# Patient Record
Sex: Male | Born: 1965 | Race: White | Hispanic: No | Marital: Married | Smoking: Never smoker
Health system: Southern US, Community
[De-identification: ages and names within clinical notes are randomized; demographics above are authoritative.]

## PROBLEM LIST (undated history)

## (undated) DIAGNOSIS — F32A Depression, unspecified: Secondary | ICD-10-CM

## (undated) DIAGNOSIS — F329 Major depressive disorder, single episode, unspecified: Secondary | ICD-10-CM

---

## 2007-02-25 ENCOUNTER — Emergency Department (HOSPITAL_COMMUNITY): Admission: EM | Admit: 2007-02-25 | Discharge: 2007-02-25 | Payer: Self-pay | Admitting: Emergency Medicine

## 2008-05-10 IMAGING — CR DG CHEST 2V
2 series · 2 of 2 positions shown · non-contrast
Comparison: none

CLINICAL DATA: 40-year-old male, chest pain.  
 CHEST - 2 VIEW:

[w chest pa]
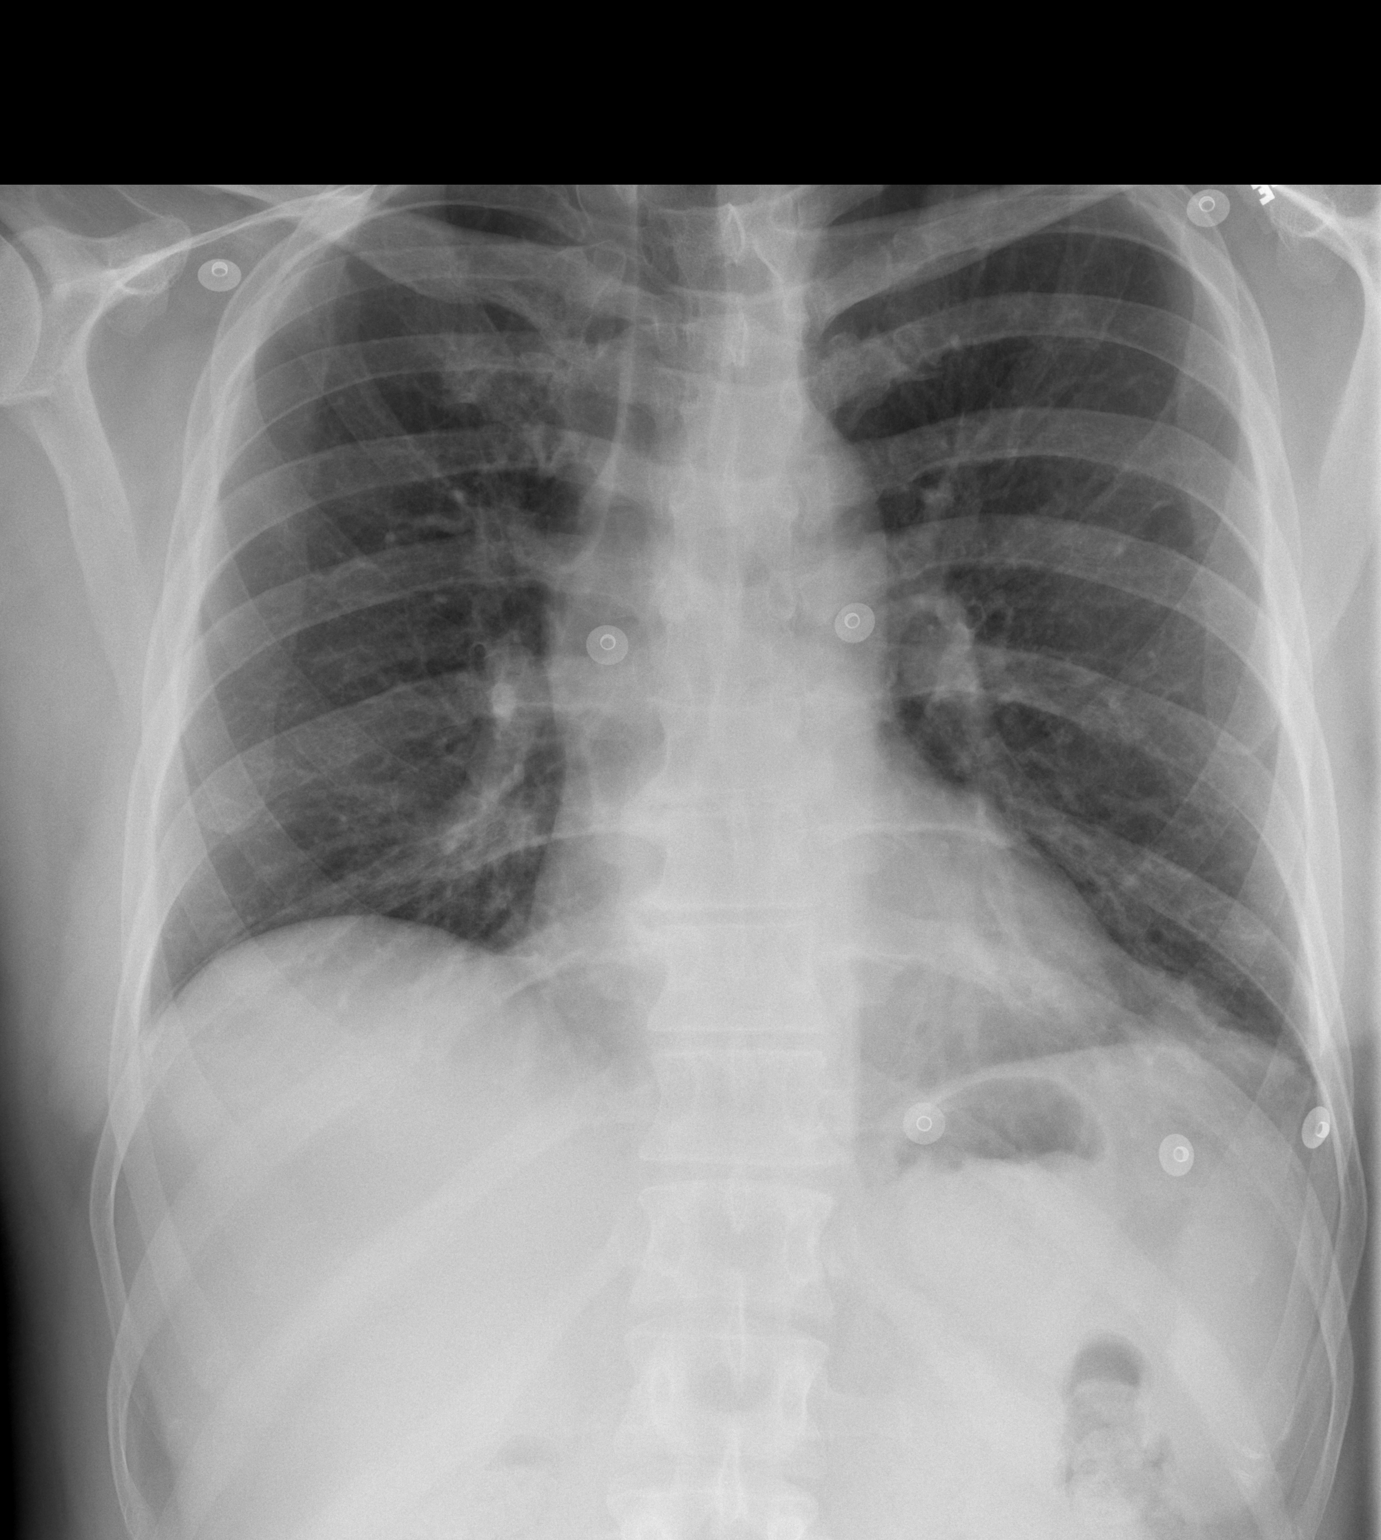

[w chest lat]
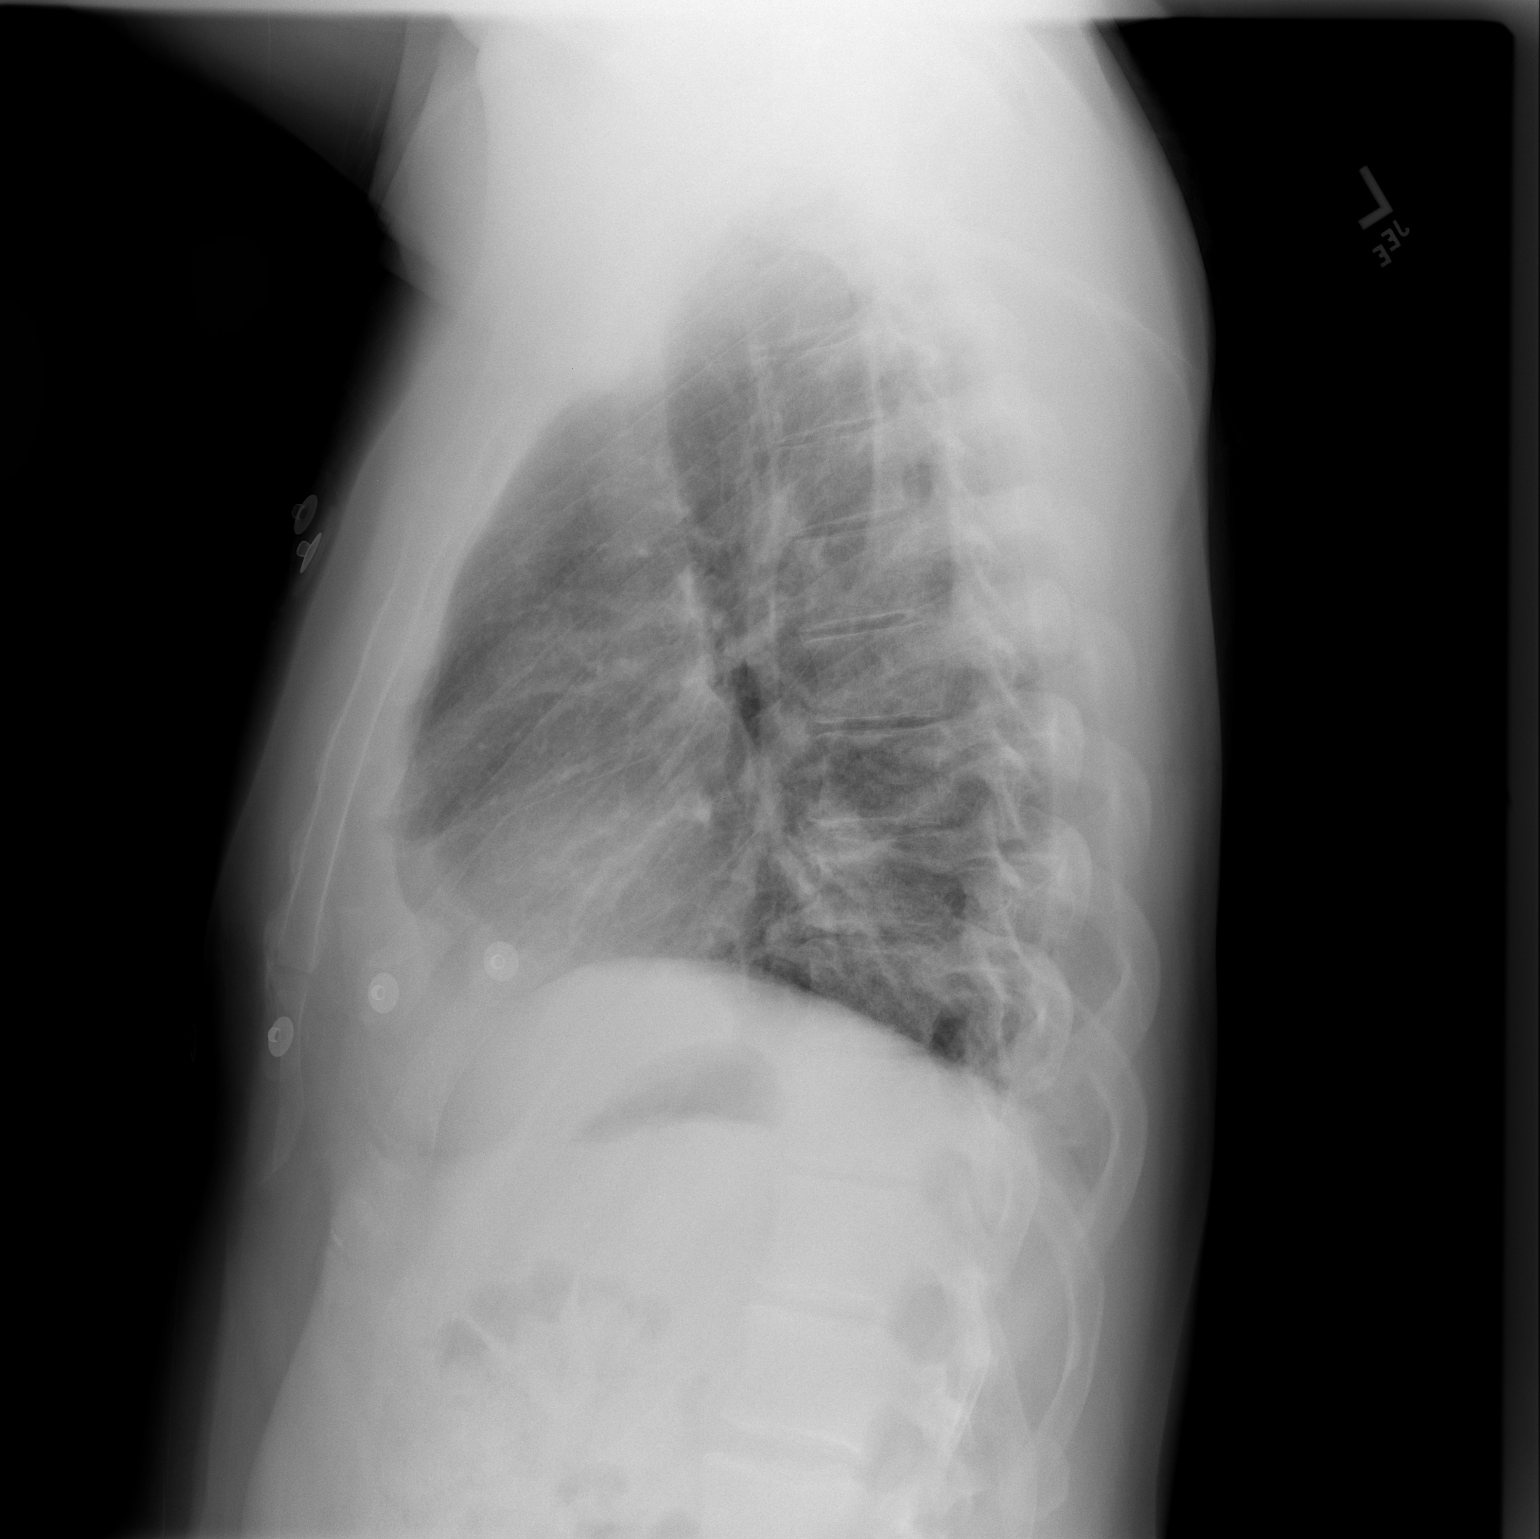

[2 of 2 positions shown; findings below may reference images not displayed]

FINDINGS: Cardiopericardial silhouette is within normal limits for size.  There is mild focal air space disease at the left base.  Some bronchitic changes are noted throughout.  Degenerative changes are seen in the thoracic spine.
IMPRESSION: Left lower lobe air space disease.  While this may represent atelectasis, early infection is not excluded.

## 2011-07-02 LAB — DIFFERENTIAL
Basophils Relative: 0
Lymphs Abs: 2
Monocytes Relative: 10
Neutro Abs: 5
Neutrophils Relative %: 64

## 2011-07-02 LAB — CBC
MCHC: 34.8
Platelets: 181
RBC: 5.07
WBC: 7.9

## 2011-07-02 LAB — I-STAT 8, (EC8 V) (CONVERTED LAB)
BUN: 18
Glucose, Bld: 109 — ABNORMAL HIGH
Hemoglobin: 15
Potassium: 3.6
TCO2: 31
pH, Ven: 7.39 — ABNORMAL HIGH

## 2011-07-02 LAB — CULTURE, BLOOD (ROUTINE X 2): Culture: NO GROWTH

## 2011-07-02 LAB — POCT I-STAT CREATININE: Operator id: 161631

## 2011-07-02 LAB — D-DIMER, QUANTITATIVE: D-Dimer, Quant: <0.22

## 2011-07-02 LAB — POCT CARDIAC MARKERS
Myoglobin, poc: 52.4
Operator id: 161631

## 2015-03-08 ENCOUNTER — Other Ambulatory Visit: Payer: Self-pay | Admitting: Physician Assistant

## 2015-03-08 ENCOUNTER — Ambulatory Visit
Admission: RE | Admit: 2015-03-08 | Discharge: 2015-03-08 | Disposition: A | Discharge status: Home / Self Care | Payer: BLUE CROSS/BLUE SHIELD | Source: Ambulatory Visit | Attending: Physician Assistant | Admitting: Physician Assistant

## 2015-03-08 DIAGNOSIS — R52 Pain, unspecified: Secondary | ICD-10-CM

## 2016-01-14 ENCOUNTER — Ambulatory Visit
Admission: EM | Admit: 2016-01-14 | Discharge: 2016-01-14 | Disposition: A | Discharge status: Home / Self Care | Payer: BLUE CROSS/BLUE SHIELD | Attending: Family Medicine | Admitting: Family Medicine

## 2016-01-14 ENCOUNTER — Encounter: Payer: Self-pay | Admitting: Emergency Medicine

## 2016-01-14 DIAGNOSIS — J302 Other seasonal allergic rhinitis: Secondary | ICD-10-CM | POA: Diagnosis not present

## 2016-01-14 MED ORDER — PREDNISONE 20 MG PO TABS: ORAL_TABLET | ORAL | Status: AC

## 2016-01-14 NOTE — ED Notes (Signed)
Patient c/o redness and drainage from both eyes for the past couple of days.  Patient denies fevers.

## 2016-01-14 NOTE — ED Provider Notes (Signed)
CSN: [id number]     Arrival date & time 01/14/16  [id number] History   First MD Initiated Contact with Patient 01/14/16 [phone]     Chief Complaint  Patient presents with  . Eye Drainage   (Consider location/radiation/quality/duration/timing/severity/associated sxs/prior Treatment) HPI Comments: 50 yo male with a 2-3 days h/o bilateral eye redness, itching and watery drainage which started after mowing lawn. Denies any purulent drainage, fevers, chills, eye injuries, fevers, chills.   The history is provided by the patient.    History reviewed. No pertinent past medical history. History reviewed. No pertinent past surgical history. History reviewed. No pertinent family history. Social History  Substance Use Topics  . Smoking status: Never Smoker   . Smokeless tobacco: None  . Alcohol Use: Yes    Review of Systems  Allergies  Review of patient's allergies indicates no known allergies.  Home Medications   Prior to Admission medications   Medication Sig Start Date End Date Taking? Authorizing Provider  predniSONE (DELTASONE) 20 MG tablet 3 tabs po qd for 2 days, then 2 tabs po qd for 3 days, then 1 tab po qd for 3 days, then half a tab po qd for 2 days 01/14/16   Payton Mccallumrlando Mical Brun, MD   Meds Ordered and Administered this Visit  Medications - No data to display  BP 142/85 mmHg  Pulse 66  Temp(Src) 97 F (36.1 C) (Tympanic)  Resp 16  Ht 6\' 2"  (1.88 m)  Wt 260 lb (117.935 kg)  BMI 33.37 kg/m2  SpO2 98% No data found.   Physical Exam  Constitutional: He appears well-developed and well-nourished. No distress.  HENT:  Head: Normocephalic and atraumatic.  Right Ear: External ear normal.  Left Ear: External ear normal.  Nose: Nose normal.  Mouth/Throat: Oropharynx is clear and moist. No oropharyngeal exudate.  Eyes: EOM are normal. Pupils are equal, round, and reactive to light. Right eye exhibits no discharge and no exudate. Left eye exhibits no discharge and no exudate. Right  conjunctiva is injected. Left conjunctiva is injected. No scleral icterus.  Skin: He is not diaphoretic.  Nursing note and vitals reviewed.   ED Course  Procedures (including critical care time)  Labs Review Labs Reviewed - No data to display  Imaging Review No results found.   Visual Acuity Review  Right Eye Distance: 20/25 uncorrected Left Eye Distance: 20/25 uncorrected Bilateral Distance:    Right Eye Near:   Left Eye Near:    Bilateral Near:         MDM   1. Seasonal allergies    Discharge Medication List as of 01/14/2016  9:29 AM    START taking these medications   Details  predniSONE (DELTASONE) 20 MG tablet 3 tabs po qd for 2 days, then 2 tabs po qd for 3 days, then 1 tab po qd for 3 days, then half a tab po qd for 2 days, Normal       1. diagnosis reviewed with patient 2. rx as per orders above; reviewed possible side effects, interactions, risks and benefits  3. Recommend supportive treatment with otc antihistamines prn  4. Follow-up prn if symptoms worsen or don't improve    Payton Mccallumrlando Canaan Prue, MD 01/20/16 [phone]

## 2016-01-29 DIAGNOSIS — R05 Cough: Secondary | ICD-10-CM | POA: Diagnosis not present

## 2016-01-29 DIAGNOSIS — Z6835 Body mass index (BMI) 35.0-35.9, adult: Secondary | ICD-10-CM | POA: Diagnosis not present

## 2016-01-29 DIAGNOSIS — J309 Allergic rhinitis, unspecified: Secondary | ICD-10-CM | POA: Diagnosis not present

## 2016-05-21 IMAGING — CR DG FOOT COMPLETE 3+V*R*
3 series · 3 of 3 positions shown · non-contrast
Comparison: August 18, 2010

CLINICAL DATA: Chronic pain lateral aspect of foot

EXAM:
RIGHT FOOT COMPLETE - 3+ VIEW

[t foot ap right]
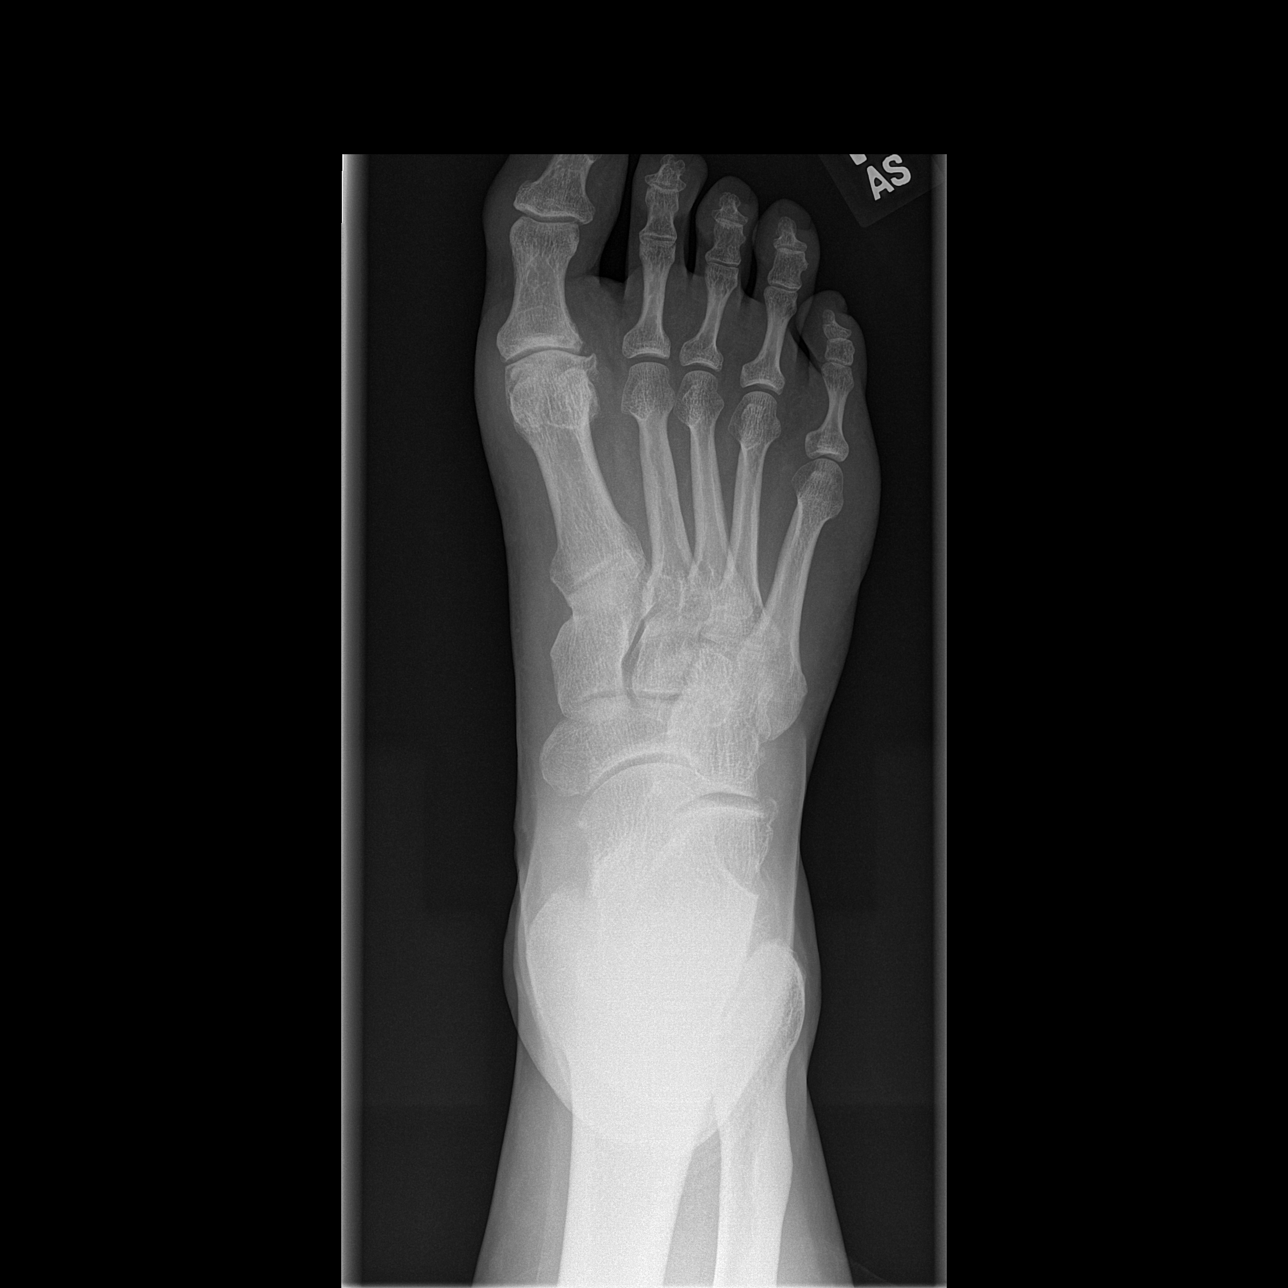

[t foot oblique right]
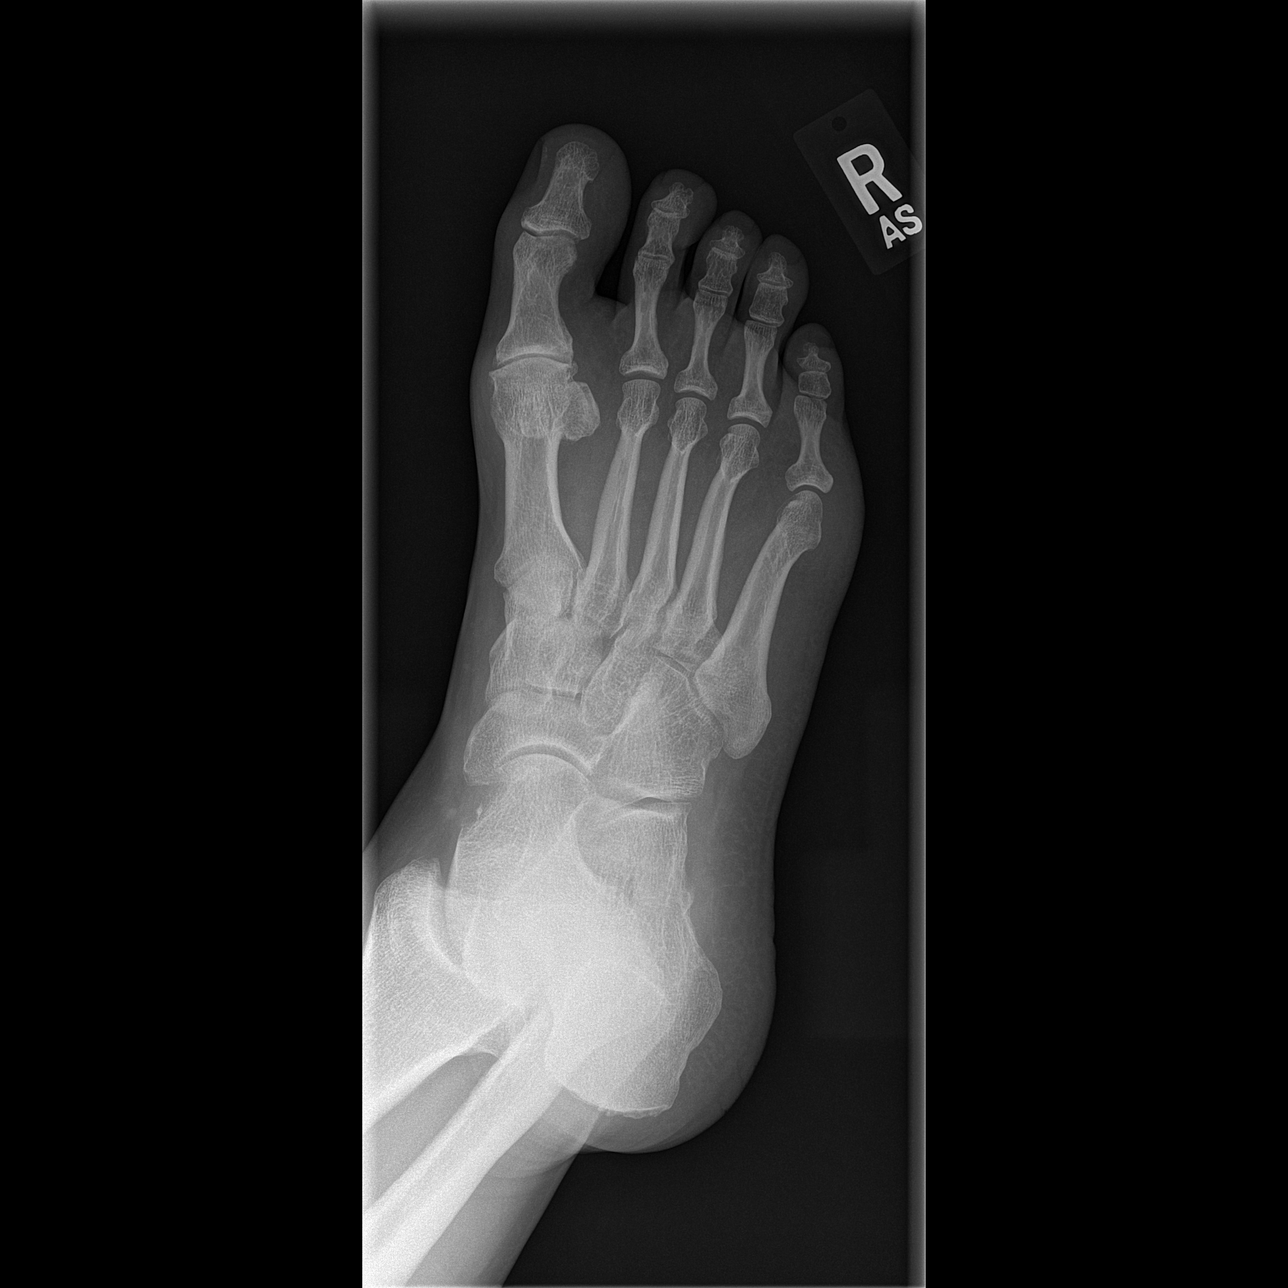

[t foot lat right]
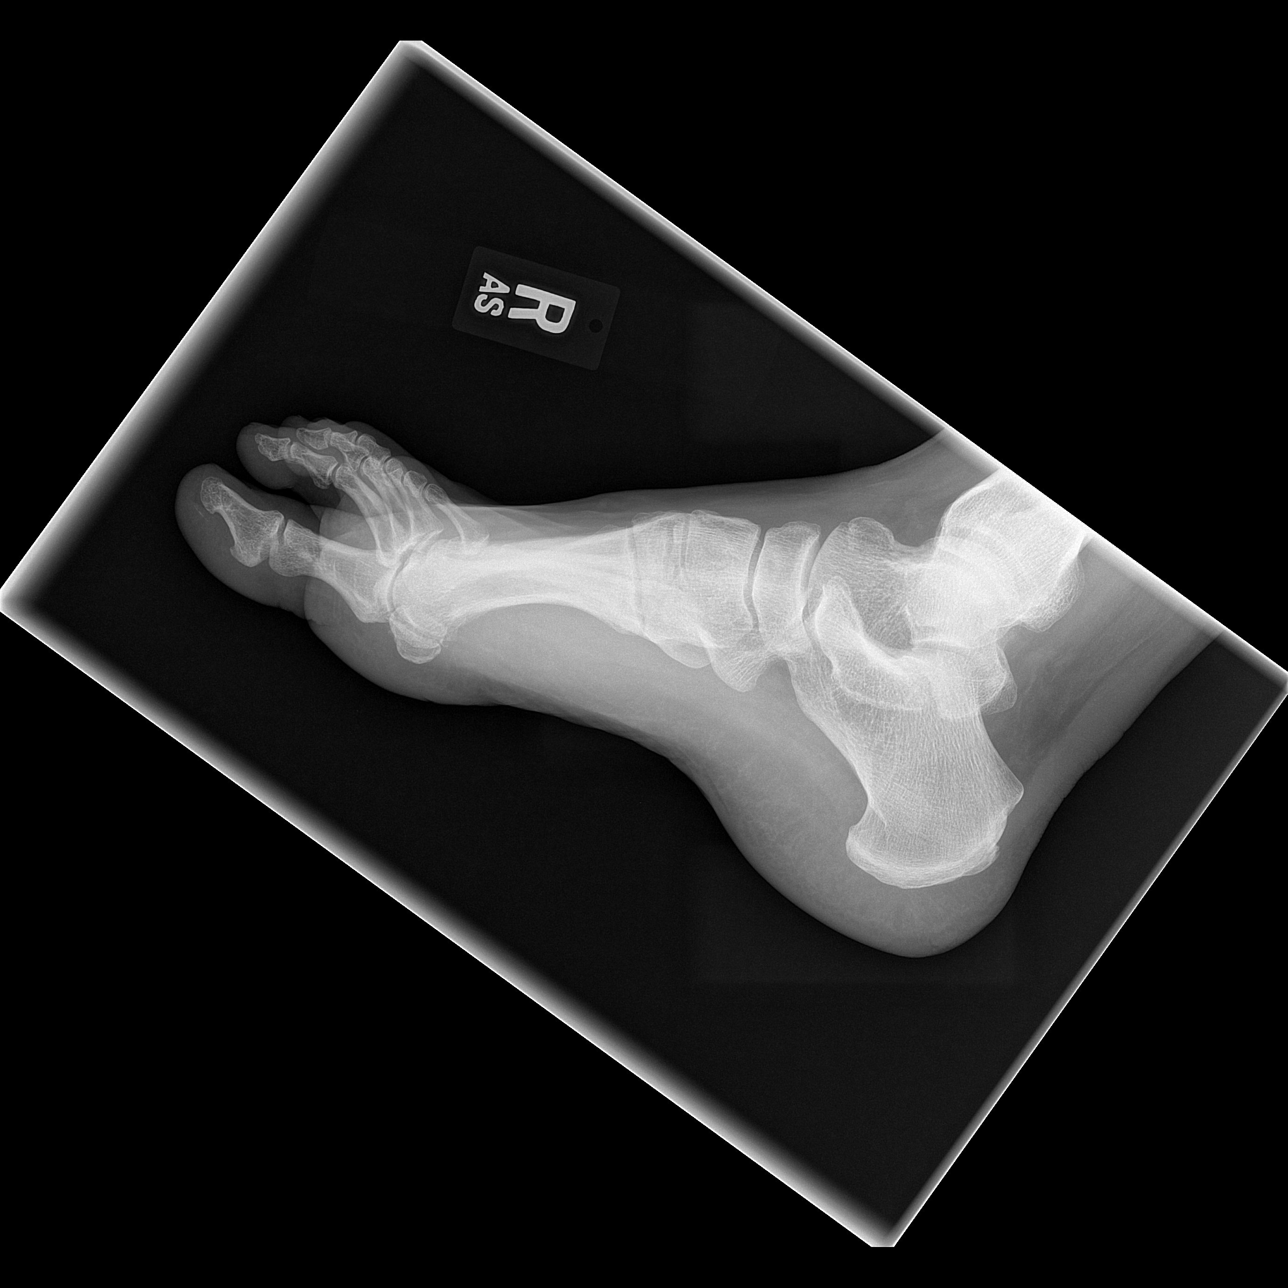

[3 of 3 positions shown; findings below may reference images not displayed]

FINDINGS: Frontal, oblique, and lateral views obtained. No fracture or
dislocation. There is osteoarthritic change in the first MTP joint
which is stable and moderately severe. No other joint space
narrowing appreciable. No erosive change.
IMPRESSION: Stable osteoarthritic change first MTP joint. No fracture or
dislocation. No new arthropathy.

## 2016-11-17 DIAGNOSIS — Z125 Encounter for screening for malignant neoplasm of prostate: Secondary | ICD-10-CM | POA: Diagnosis not present

## 2016-11-17 DIAGNOSIS — F419 Anxiety disorder, unspecified: Secondary | ICD-10-CM | POA: Diagnosis not present

## 2016-11-17 DIAGNOSIS — F329 Major depressive disorder, single episode, unspecified: Secondary | ICD-10-CM | POA: Diagnosis not present

## 2016-11-17 DIAGNOSIS — Z0000 Encounter for general adult medical examination without abnormal findings: Secondary | ICD-10-CM | POA: Diagnosis not present

## 2017-03-25 DIAGNOSIS — M79671 Pain in right foot: Secondary | ICD-10-CM | POA: Diagnosis not present

## 2017-09-28 ENCOUNTER — Ambulatory Visit
Admission: EM | Admit: 2017-09-28 | Discharge: 2017-09-28 | Disposition: A | Discharge status: Home / Self Care | Payer: BLUE CROSS/BLUE SHIELD | Attending: Family Medicine | Admitting: Family Medicine

## 2017-09-28 ENCOUNTER — Other Ambulatory Visit: Payer: Self-pay

## 2017-09-28 DIAGNOSIS — Z23 Encounter for immunization: Secondary | ICD-10-CM | POA: Diagnosis not present

## 2017-09-28 DIAGNOSIS — S41151A Open bite of right upper arm, initial encounter: Secondary | ICD-10-CM | POA: Diagnosis not present

## 2017-09-28 DIAGNOSIS — W540XXA Bitten by dog, initial encounter: Secondary | ICD-10-CM

## 2017-09-28 DIAGNOSIS — S41111A Laceration without foreign body of right upper arm, initial encounter: Secondary | ICD-10-CM | POA: Diagnosis not present

## 2017-09-28 HISTORY — DX: Depression, unspecified: F32.A

## 2017-09-28 HISTORY — DX: Major depressive disorder, single episode, unspecified: F32.9

## 2017-09-28 NOTE — ED Triage Notes (Addendum)
Pt reports animal bite by a neighbor's dog in right upper arm. Large area of skin ripped open with sub q tissue visible. Left hand knuckles noted with bite marks. Pt was trying to break up a fight between neighbors dog and his

## 2017-09-28 NOTE — ED Provider Notes (Signed)
[facility]    CSN: [id number] Arrival date & time: 09/28/17  1736  History   Chief Complaint Chief Complaint  Patient presents with  . Animal Bite   HPI  52 year old male presents with a large laceration secondary to an animal bite.  Patient states that he was attempting to break up a fight between his dog and another dog.  He states that it was a neighbor's dog.  Dog can be quarantined.  States that he was bitten on his right upper arm.  He also has some abrasions and possible punctures on his hands.  Bleeding well controlled at this time.  He is in a great deal of pain.  8/10 in severity.  No other reported symptoms.  No other combines or concerns at this time.  Social History Social History   Tobacco Use  . Smoking status: Never Smoker  . Smokeless tobacco: Never Used  Substance Use Topics  . Alcohol use: Yes    Comment: social  . Drug use: Not on file   Allergies   Patient has no known allergies.  Review of Systems Review of Systems  Constitutional: Negative.   Skin: Positive for wound.   Physical Exam Triage Vital Signs ED Triage Vitals  Enc Vitals Group     BP 09/28/17 1758 (!) 160/98     Pulse Rate 09/28/17 1758 (!) 112     Resp 09/28/17 1758 20     Temp 09/28/17 1758 98.5 F (36.9 C)     Temp Source 09/28/17 1758 Oral     SpO2 09/28/17 1758 98 %     Weight 09/28/17 1757 270 lb (122.5 kg)     Height 09/28/17 1759 6\' 2"  (1.88 m)     Head Circumference --      Peak Flow --      Pain Score 09/28/17 1754 8     Pain Loc --      Pain Edu? --      Excl. in GC? --    Updated Vital Signs BP (!) 160/98 (BP Location: Left Arm)   Pulse (!) 112   Temp 98.5 F (36.9 C) (Oral)   Resp 20   Ht 6\' 2"  (1.88 m)   Wt 270 lb (122.5 kg)   SpO2 98%   BMI 34.67 kg/m   Physical Exam  Constitutional: He is oriented to person, place, and time. He appears well-developed.  Appears in distress secondary to pain.  Neurological: He is alert and oriented to  person, place, and time.  Skin:  R upper arm -large curvilinear open wound with subcutaneous tissue noted.  Bleeding is controlled.  Psychiatric: He has a normal mood and affect. His behavior is normal.  Nursing note and vitals reviewed.  UC Treatments / Results  Labs (all labs ordered are listed, but only abnormal results are displayed) Labs Reviewed - No data to display  EKG  EKG Interpretation None       Radiology No results found.  Procedures Procedures (including critical care time)  Medications Ordered in UC Medications - No data to display   Initial Impression / Assessment and Plan / UC Course  I have reviewed the triage vital signs and the nursing notes.  Pertinent labs & imaging results that were available during my care of the patient were reviewed by me and considered in my medical decision making (see chart for details).     52 year old male presents with a large open wound (complicated wound) following a  dog bite.  Given the extent of the wound, I am sending him to the ER for repair/treatment.  Final Clinical Impressions(s) / UC Diagnoses   Final diagnoses:  Dog bite, initial encounter    ED Discharge Orders    None     Controlled Substance Prescriptions [place] Controlled Substance Registry consulted? Not Applicable   Tommie SamsCook, Peterson Mathey G, DO 09/28/17 [id number]

## 2017-09-28 NOTE — ED Notes (Signed)
4x4 to wound and wrapped in coban

## 2017-10-11 ENCOUNTER — Ambulatory Visit
Admission: EM | Admit: 2017-10-11 | Discharge: 2017-10-11 | Disposition: A | Discharge status: Home / Self Care | Payer: BLUE CROSS/BLUE SHIELD | Attending: Family Medicine | Admitting: Family Medicine

## 2017-10-11 ENCOUNTER — Encounter: Payer: Self-pay | Admitting: *Deleted

## 2017-10-11 DIAGNOSIS — S41151D Open bite of right upper arm, subsequent encounter: Secondary | ICD-10-CM | POA: Diagnosis not present

## 2017-10-11 DIAGNOSIS — L089 Local infection of the skin and subcutaneous tissue, unspecified: Secondary | ICD-10-CM

## 2017-10-11 DIAGNOSIS — Z4802 Encounter for removal of sutures: Secondary | ICD-10-CM | POA: Diagnosis not present

## 2017-10-11 DIAGNOSIS — T148XXA Other injury of unspecified body region, initial encounter: Principal | ICD-10-CM

## 2017-10-11 MED ORDER — CLINDAMYCIN HCL 300 MG PO CAPS: 300.0000 mg | ORAL_CAPSULE | Freq: Three times a day (TID) | ORAL | 0 refills | Status: AC

## 2017-10-11 MED ORDER — SULFAMETHOXAZOLE-TRIMETHOPRIM 800-160 MG PO TABS: 1.0000 | ORAL_TABLET | Freq: Two times a day (BID) | ORAL | 0 refills | Status: AC

## 2017-10-11 NOTE — ED Provider Notes (Signed)
[facility]    CSN: [id number] Arrival date & time: 10/11/17  1521     History   Chief Complaint Chief Complaint  Patient presents with  . Suture / Staple Removal    HPI Cameron Schroeder is a 52 y.o. male.   51 yo male with a h/o dog bite s/p wound repair in the ED 13 days ago, here for suture removal. States area only slightly tender and itchy. Patient completed a course of Augmentin 875mg  bid for 10 days. Denies any fevers or chills.    The history is provided by the patient.  Suture / Staple Removal     Past Medical History:  Diagnosis Date  . Depression     There are no active problems to display for this patient.   History reviewed. No pertinent surgical history.     Home Medications    Prior to Admission medications   Medication Sig Start Date End Date Taking? Authorizing Provider  sertraline (ZOLOFT) 100 MG tablet Take 100 mg by mouth daily.   Yes [provider]  clindamycin (CLEOCIN) 300 MG capsule Take 1 capsule (300 mg total) by mouth 3 (three) times daily. 10/11/17   Payton Mccallum, MD  predniSONE (DELTASONE) 20 MG tablet 3 tabs po qd for 2 days, then 2 tabs po qd for 3 days, then 1 tab po qd for 3 days, then half a tab po qd for 2 days 01/14/16   Payton Mccallum, MD  sulfamethoxazole-trimethoprim (BACTRIM DS,SEPTRA DS) 800-160 MG tablet Take 1 tablet by mouth 2 (two) times daily. 10/11/17   Payton Mccallum, MD    Family History Family History  Problem Relation Age of Onset  . Aortic aneurysm Mother     Social History Social History   Tobacco Use  . Smoking status: Never Smoker  . Smokeless tobacco: Never Used  Substance Use Topics  . Alcohol use: Yes    Comment: social  . Drug use: Not on file     Allergies   Patient has no known allergies.   Review of Systems Review of Systems   Physical Exam Triage Vital Signs ED Triage Vitals  Enc Vitals Group     BP 10/11/17 1539 (!) 152/83     Pulse Rate 10/11/17 1539 76       Resp 10/11/17 1539 16     Temp 10/11/17 1539 98.8 F (37.1 C)     Temp Source 10/11/17 1539 Oral     SpO2 10/11/17 1539 98 %     Weight 10/11/17 1543 270 lb (122.5 kg)     Height 10/11/17 1543 6\' 2"  (1.88 m)     Head Circumference --      Peak Flow --      Pain Score 10/11/17 1544 0     Pain Loc --      Pain Edu? --      Excl. in GC? --    No data found.  Updated Vital Signs BP (!) 152/83 (BP Location: Left Arm)   Pulse 76   Temp 98.8 F (37.1 C) (Oral)   Resp 16   Ht 6\' 2"  (1.88 m)   Wt 270 lb (122.5 kg)   SpO2 98%   BMI 34.67 kg/m   Visual Acuity Right Eye Distance:   Left Eye Distance:   Bilateral Distance:    Right Eye Near:   Left Eye Near:    Bilateral Near:     Physical Exam  Constitutional: He appears  well-developed and well-nourished. No distress.  Skin: He is not diaphoretic. There is erythema.  22 sutures in place on posterior upper right arm skin; no drainage; mild surrounding warmth, blanchable erythema and tenderness to palpation  Nursing note and vitals reviewed.    UC Treatments / Results  Labs (all labs ordered are listed, but only abnormal results are displayed) Labs Reviewed - No data to display  EKG  EKG Interpretation None       Radiology No results found.  Procedures Procedures (including critical care time)  Medications Ordered in UC Medications - No data to display   Initial Impression / Assessment and Plan / UC Course  I have reviewed the triage vital signs and the nursing notes.  Pertinent labs & imaging results that were available during my care of the patient were reviewed by me and considered in my medical decision making (see chart for details).       Final Clinical Impressions(s) / UC Diagnoses   Final diagnoses:  Wound infection  Visit for suture removal    ED Discharge Orders        Ordered    sulfamethoxazole-trimethoprim (BACTRIM DS,SEPTRA DS) 800-160 MG tablet  2 times daily     10/11/17  1602    clindamycin (CLEOCIN) 300 MG capsule  3 times daily     10/11/17 1602     1. diagnosis reviewed with patient 2. Sutures removed  3. rx as per orders above; reviewed possible side effects, interactions, risks and benefits  3. Recommend supportive treatment with warm compresses to area 4. Follow-up with PCP in 3-4 days for re-evaluation or sooner prn if any problems or worsening  Controlled Substance Prescriptions [place] Controlled Substance Registry consulted? Not Applicable   Payton Mccallumonty, Ramanda Paules, MD 10/11/17 [id number]

## 2017-10-11 NOTE — Discharge Instructions (Signed)
Warm compresses to area 2-3 times daily Follow up with Primary Care Provider later this week (Thurs or Friday) or sooner as needed if any problems.

## 2017-10-11 NOTE — ED Triage Notes (Signed)
Pt here for suture removal from right upper arm. Site is healing well.

## 2019-04-21 DIAGNOSIS — F339 Major depressive disorder, recurrent, unspecified: Secondary | ICD-10-CM | POA: Diagnosis not present

## 2019-04-21 DIAGNOSIS — Z0000 Encounter for general adult medical examination without abnormal findings: Secondary | ICD-10-CM | POA: Diagnosis not present

## 2019-04-21 DIAGNOSIS — F419 Anxiety disorder, unspecified: Secondary | ICD-10-CM | POA: Diagnosis not present

## 2019-05-04 DIAGNOSIS — Z13220 Encounter for screening for lipoid disorders: Secondary | ICD-10-CM | POA: Diagnosis not present

## 2019-05-04 DIAGNOSIS — Z8249 Family history of ischemic heart disease and other diseases of the circulatory system: Secondary | ICD-10-CM | POA: Diagnosis not present

## 2019-05-04 DIAGNOSIS — Z125 Encounter for screening for malignant neoplasm of prostate: Secondary | ICD-10-CM | POA: Diagnosis not present

## 2019-05-04 DIAGNOSIS — Z131 Encounter for screening for diabetes mellitus: Secondary | ICD-10-CM | POA: Diagnosis not present

## 2019-05-04 DIAGNOSIS — Z0000 Encounter for general adult medical examination without abnormal findings: Secondary | ICD-10-CM | POA: Diagnosis not present

## 2019-06-22 DIAGNOSIS — F419 Anxiety disorder, unspecified: Secondary | ICD-10-CM | POA: Diagnosis not present

## 2019-06-22 DIAGNOSIS — F339 Major depressive disorder, recurrent, unspecified: Secondary | ICD-10-CM | POA: Diagnosis not present

## 2019-08-24 DIAGNOSIS — F411 Generalized anxiety disorder: Secondary | ICD-10-CM | POA: Diagnosis not present

## 2019-08-24 DIAGNOSIS — F900 Attention-deficit hyperactivity disorder, predominantly inattentive type: Secondary | ICD-10-CM | POA: Diagnosis not present

## 2019-12-12 DIAGNOSIS — F900 Attention-deficit hyperactivity disorder, predominantly inattentive type: Secondary | ICD-10-CM | POA: Diagnosis not present

## 2020-06-18 DIAGNOSIS — F419 Anxiety disorder, unspecified: Secondary | ICD-10-CM | POA: Diagnosis not present

## 2020-06-18 DIAGNOSIS — F339 Major depressive disorder, recurrent, unspecified: Secondary | ICD-10-CM | POA: Diagnosis not present

## 2020-06-24 DIAGNOSIS — F900 Attention-deficit hyperactivity disorder, predominantly inattentive type: Secondary | ICD-10-CM | POA: Diagnosis not present

## 2020-12-21 DIAGNOSIS — M109 Gout, unspecified: Secondary | ICD-10-CM | POA: Diagnosis not present

## 2020-12-23 DIAGNOSIS — F900 Attention-deficit hyperactivity disorder, predominantly inattentive type: Secondary | ICD-10-CM | POA: Diagnosis not present

## 2021-03-31 DIAGNOSIS — L282 Other prurigo: Secondary | ICD-10-CM | POA: Diagnosis not present

## 2021-03-31 DIAGNOSIS — L255 Unspecified contact dermatitis due to plants, except food: Secondary | ICD-10-CM | POA: Diagnosis not present

## 2021-06-30 DIAGNOSIS — F411 Generalized anxiety disorder: Secondary | ICD-10-CM | POA: Diagnosis not present

## 2021-06-30 DIAGNOSIS — F900 Attention-deficit hyperactivity disorder, predominantly inattentive type: Secondary | ICD-10-CM | POA: Diagnosis not present

## 2021-08-05 DIAGNOSIS — M1712 Unilateral primary osteoarthritis, left knee: Secondary | ICD-10-CM | POA: Diagnosis not present
